# Patient Record
Sex: Male | Born: 1990 | Race: Black or African American | Hispanic: No | Marital: Single | Smoking: Current some day smoker
Health system: Southern US, Community
[De-identification: ages and names within clinical notes are randomized; demographics above are authoritative.]

## PROBLEM LIST (undated history)

## (undated) DIAGNOSIS — F329 Major depressive disorder, single episode, unspecified: Secondary | ICD-10-CM

## (undated) DIAGNOSIS — F319 Bipolar disorder, unspecified: Secondary | ICD-10-CM

## (undated) DIAGNOSIS — F32A Depression, unspecified: Secondary | ICD-10-CM

## (undated) HISTORY — PX: NO PAST SURGERIES: SHX2092

---
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
## 2012-08-04 ENCOUNTER — Emergency Department (INDEPENDENT_AMBULATORY_CARE_PROVIDER_SITE_OTHER)
Admission: EM | Admit: 2012-08-04 | Discharge: 2012-08-04 | Disposition: A | Discharge status: Home / Self Care | Payer: BC Managed Care – PPO | Source: Home / Self Care | Attending: Emergency Medicine | Admitting: Emergency Medicine

## 2012-08-04 ENCOUNTER — Encounter (HOSPITAL_COMMUNITY): Payer: Self-pay | Admitting: *Deleted

## 2012-08-04 DIAGNOSIS — B079 Viral wart, unspecified: Secondary | ICD-10-CM

## 2012-08-04 NOTE — ED Notes (Signed)
Pt reports foreign body in top of head that has been there for over a year

## 2012-08-04 NOTE — ED Provider Notes (Signed)
Chief Complaint  Patient presents with  . Foreign Body in Skin    History of Present Illness:   The patient is an 21 year old male who has a lesion on his right parietal area which has been present for about the past year. He denies any injury, but the area. It's not painful or itchy, just a little bit irritating. He denies any other skin lesions.  Review of Systems:  Other than noted above, the patient denies any of the following symptoms: Systemic:  No fever, chills, sweats, weight loss, or fatigue. ENT:  No nasal congestion, rhinorrhea, sore throat, swelling of lips, tongue or throat. Resp:  No cough, wheezing, or shortness of breath. Skin:  No rash, itching, nodules, or suspicious lesions.  PMFSH:  Past medical history, family history, social history, meds, and allergies were reviewed.  Physical Exam:   Vital signs:  BP 113/68  Pulse 75  Temp 98.4 F (36.9 C) (Oral)  Resp 16  SpO2 99% Gen:  Alert, oriented, in no distress. ENT:  Pharynx clear, no intraoral lesions, moist mucous membranes. Lungs:  Clear to auscultation. Skin:  There is a 5 mm wartlike lesion on the right parietal area.  Procedure Note:  Verbal informed consent was obtained from the patient.  Risks and benefits were outlined with the patient.  Patient understands and accepts these risks.  Identity of the patient was confirmed verbally and by armband.    Procedure was performed as followed:  The lesion was prepped with alcohol, anesthetized with 2 mL of 2% Xylocaine with epinephrine, then cauterized with electrocautery. After vigorous cauterization, the lesion just fell off on its own and the base was then cauterized further. There was no bleeding. The patient was instructed in wound care.  Patient tolerated the procedure well without any immediate complications.  Assessment:  The encounter diagnosis was Wart.  Plan:   1.  The following meds were prescribed:   New Prescriptions   No medications on file   2.   The patient was instructed in symptomatic care and handouts were given. 3.  The patient was told to return if becoming worse in any way, if no better in 3 or 4 days, and given some red flag symptoms that would indicate earlier return.     Reuben Likes, MD 08/04/12 [phone]

## 2013-12-21 ENCOUNTER — Encounter (HOSPITAL_COMMUNITY): Payer: Self-pay | Admitting: Emergency Medicine

## 2013-12-21 ENCOUNTER — Emergency Department (HOSPITAL_COMMUNITY)
Admission: EM | Admit: 2013-12-21 | Discharge: 2013-12-21 | Disposition: A | Discharge status: Home / Self Care | Payer: BC Managed Care – PPO | Attending: Emergency Medicine | Admitting: Emergency Medicine

## 2013-12-21 DIAGNOSIS — F32A Depression, unspecified: Secondary | ICD-10-CM

## 2013-12-21 DIAGNOSIS — F3289 Other specified depressive episodes: Secondary | ICD-10-CM | POA: Insufficient documentation

## 2013-12-21 DIAGNOSIS — F329 Major depressive disorder, single episode, unspecified: Secondary | ICD-10-CM | POA: Insufficient documentation

## 2013-12-21 DIAGNOSIS — F17200 Nicotine dependence, unspecified, uncomplicated: Secondary | ICD-10-CM | POA: Insufficient documentation

## 2013-12-21 DIAGNOSIS — R45851 Suicidal ideations: Secondary | ICD-10-CM | POA: Insufficient documentation

## 2013-12-21 LAB — COMPREHENSIVE METABOLIC PANEL
ALT: 8 U/L (ref 0–53)
AST: 17 U/L (ref 0–37)
Albumin: 4.6 g/dL (ref 3.5–5.2)
Alkaline Phosphatase: 75 U/L (ref 39–117)
BUN: 17 mg/dL (ref 6–23)
CALCIUM: 9.8 mg/dL (ref 8.4–10.5)
CO2: 24 mEq/L (ref 19–32)
Chloride: 103 mEq/L (ref 96–112)
Creatinine, Ser: 0.96 mg/dL (ref 0.50–1.35)
GFR calc non Af Amer: >90 mL/min (ref >90)
GLUCOSE: 96 mg/dL (ref 70–99)
Potassium: 4.3 mEq/L (ref 3.7–5.3)
SODIUM: 141 meq/L (ref 137–147)
TOTAL PROTEIN: 8.2 g/dL (ref 6.0–8.3)
Total Bilirubin: 0.6 mg/dL (ref 0.3–1.2)

## 2013-12-21 LAB — ACETAMINOPHEN LEVEL: Acetaminophen (Tylenol), Serum: <15 ug/mL (ref 10–30)

## 2013-12-21 LAB — CBC WITH DIFFERENTIAL/PLATELET
Basophils Absolute: 0 10*3/uL (ref 0.0–0.1)
Basophils Relative: 0 % (ref 0–1)
EOS ABS: 0.2 10*3/uL (ref 0.0–0.7)
EOS PCT: 2 % (ref 0–5)
HCT: 50.8 % (ref 39.0–52.0)
Hemoglobin: 16.8 g/dL (ref 13.0–17.0)
LYMPHS ABS: 3.4 10*3/uL (ref 0.7–4.0)
Lymphocytes Relative: 49 % — ABNORMAL HIGH (ref 12–46)
MCH: 30.2 pg (ref 26.0–34.0)
MCHC: 33.1 g/dL (ref 30.0–36.0)
MCV: 91.2 fL (ref 78.0–100.0)
MONOS PCT: 6 % (ref 3–12)
Monocytes Absolute: 0.4 10*3/uL (ref 0.1–1.0)
Neutro Abs: 2.9 10*3/uL (ref 1.7–7.7)
Neutrophils Relative %: 43 % (ref 43–77)
PLATELETS: 218 10*3/uL (ref 150–400)
RBC: 5.57 MIL/uL (ref 4.22–5.81)
RDW: 13.5 % (ref 11.5–15.5)
WBC: 6.8 10*3/uL (ref 4.0–10.5)

## 2013-12-21 LAB — RAPID URINE DRUG SCREEN, HOSP PERFORMED
AMPHETAMINES: NOT DETECTED
BARBITURATES: NOT DETECTED
BENZODIAZEPINES: NOT DETECTED
COCAINE: NOT DETECTED
Opiates: NOT DETECTED
TETRAHYDROCANNABINOL: POSITIVE — AB

## 2013-12-21 LAB — ETHANOL

## 2013-12-21 LAB — SALICYLATE LEVEL: Salicylate Lvl: <2 mg/dL — ABNORMAL LOW (ref 2.8–20.0)

## 2013-12-21 NOTE — BH Assessment (Signed)
[facility] Assessment Progress Note  At 20:36 I spoke to EDP Dr Fayrene FearingJames in anticipation of TTS assessment.  Doylene Canninghomas Caedyn Raygoza, MA Triage Specialist 12/21/2013 @ 20:40

## 2013-12-21 NOTE — ED Notes (Signed)
Bed: WBH39 Expected date:  Expected time:  Means of arrival:  Comments: Hold for triage 1 

## 2013-12-21 NOTE — ED Notes (Signed)
After triaging patient.  Pt stated that he wanted to leave.  Attempted to let patient know that MD needed to evaluate patient dt suicidal ideations.  Pt again stated he was leaving and started walking out of building.  D/T extreme risk for patient safety, I notified security to assist in bringing patient back  Into building.  Notified MD of occurrence.  Dr. Fayrene FearingJames went immediately to see patient.

## 2013-12-21 NOTE — ED Notes (Signed)
Pt states that he took 2 xanax last night and feel that because of this his emotions are heightened. When he saw his mother today after she was released from the hospital, he felt like he was still high and said something he wasn't suppose to say. Pt encouraged to change into scrubs, provide urine and allow blood work to be drawn. Then to discuss this with the counselor

## 2013-12-21 NOTE — BH Assessment (Signed)
Assessment Note  Mitchell Fuller is a 23 y.o. single black male.  [facility] initially accompanied by his girlfriend, his pastor, and [employer] officers.  No IVC paperwork accompanied the pt.  At the time of this assessment, pt is unaccompanied.  He is here today after sending a suicidal statement by text message to the pastor.  Stressors: Pt reports that within the past year his grandmother and his uncle, both of whom were involved in raising him, perished.  Today he discovered that he mother had recently been hospitalized unbeknownst to the pt.  These events were very upsetting to the pt and caused him to seek the support of the pastor.  Pt also reports that the accompanying girlfriend, with whom pt lives, is a relationship of convenience because she lives nearby.  However, his true love is the mother of his 186 month old child who lives some distance away.  He is hoping to be able to pursue that relationship more fully in the future.  Juggling these relationships has also been stressful for the pt.  Lethality: Suicidality: Pt reports that today, after hearing about his mother's recent hospitalization, he sent a text message to his pastor saying, "I feel like killing myself."  He reports that he has a close relationship with the pastor, often joking with him.  As a consequence, he believed that the pastor might not respond immediately to a message of lesser urgency.  Pt denies that there was any actual suicidal thought that accompanied the message, and in fact, denies ever having suicidal thought.  However, he reports that he has made similar statements under similar circumstances in the past, and in the fall of 2014 they resulted in pt being admitted to a psychiatric hospital.  Pt denies any current or past suicide plans or intent, any history of suicide attempts, or any history of self mutilation.  His mood is bright and pleasant during assessment, but he acknowledges some  recent problems with anxiety including agoraphobia, as well as depression with symptoms noted in the "risk to self" assessment below. Homicidality: Pt denies homicidal thoughts or physical aggression.  Pt denies having access to firearms.  Pt reports having a court date on 01/02/2014 for driving a car with expired registration.  Pt was very agitated after being brought to the psychiatric holding area of the ED, wanting to leave immediately, but became calm, cooperative and pleasant during assessment. Psychosis: Pt denies any recent hallucinations, but did experience them while on Zoloft several months ago.  Pt does not appear to be responding to internal stimuli and exhibits no delusional thought.  Pt's reality testing appears to be intact. Substance Abuse: Pt denies any current or past substance abuse problems.  Pt does not appear to be intoxicated or in withdrawal at this time.  Social History: Pt identifies his mother as his main social support.  He also has a close relationship with his pastor.  As noted he was raised in part by his now deceased grandmother and uncle.  He lives with the aforesaid girlfriend, along with the girlfriend's cousin, the cousin's boyfriend, and other roommates.  Pt has a 266 month old son, with whom he has an ongoing relationship, along with the son's mother, as noted above.  It would appear that he conceals from the girlfriend his intentions for the future with the son's mother, while concealing from the mother his relationship with the girlfriend.  Pt recently found employment with [employer].  Treatment History: As noted above, pt made a suicidal statement in the fall of 2014 that resulted in him being held at the [facility] for three days, evidently while they sought placement for him.  He was apparently transferred to [facility] under IVC for several days, where he was started on Zoloft.  He followed up with [facility] upon discharge, but did not like  the service that they provided, and after experiencing averse reactions to the Zoloft, he stopped taking it and stopped keeping appointments.  He also reports that at 23 y/o he again made attention seeking suicidal statements that resulted in him going to a [facility] ED, receiving a tele-psychiatry consult, and being discharged home.  Today pt does not feel that he presents a life threatening danger to himself.  He does not want to be admitted to a psychiatric facility, fearing that it would jeopardize his recently gained employment.  He is willing to contract for safety.  He is very much interested in receiving referrals to outpatient behavioral health providers.  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 Medical history non-contributory    Axis IV: problems related to legal system/crime, problems with primary support group and parent-child relational problems and problems related to grieving Axis V: GAF = 45  Past Medical History:  Past Medical History  Diagnosis Date  . Medical history non-contributory     Past Surgical History  Procedure Laterality Date  . No past surgeries      Family History: History reviewed. No pertinent family history.  Social History:  reports that he has been smoking.  He does not have any smokeless tobacco history on file. He reports that he drinks alcohol. He reports that he uses illicit drugs (Marijuana).  Additional Social History:  Alcohol / Drug Use Pain Medications: Denies Prescriptions: Denies Over the Counter: Denies History of alcohol / drug use?: No history of alcohol / drug abuse  CIWA: CIWA-Ar BP: 131/87 mmHg Pulse Rate: 80 COWS:    Allergies: No Known Allergies  Home Medications:  (Not in a hospital admission)  OB/GYN Status:  No LMP for male patient.  General Assessment Data Location of Assessment: [facility] ED Is this a Tele or  Face-to-Face Assessment?: Face-to-Face Is this an Initial Assessment or a Re-assessment for this encounter?: Initial Assessment Living Arrangements: Other (Comment) (Girlfriend, her cousin, & several roommates) Can pt return to current living arrangement?: Yes Admission Status: Voluntary Is patient capable of signing voluntary admission?: Yes Transfer from: Acute Hospital Referral Source: Other Leisure centre manager)     [facility] Crisis Care Plan Living Arrangements: Other (Comment) (Girlfriend, her cousin, & several roommates) Name of Psychiatrist: None Name of Therapist: None  Education Status Is patient currently in school?: No Contact person: Leone Payor - mother - [phone]  Risk to self Suicidal Ideation: No Suicidal Intent: No Is patient at risk for suicide?: No Suicidal Plan?: No Access to Means: No What has been your use of drugs/alcohol within the last 12 months?: Denies Previous Attempts/Gestures: No How many times?: 0 Other Self Harm Risks: Sent TM, "I feel like killing myself," to pastor today as attention seeking gesture.  Denies ever having plan or intent. Triggers for Past Attempts: Other (Comment) (Not applicable) Intentional Self Injurious Behavior: None Family Suicide History: No Recent stressful life event(s): Loss (Comment);Other (Comment) (Death of 2 loved ones, mom's illness; Px w/ girlfriend &  son) Persecutory voices/beliefs?: No Depression: Yes Depression Symptoms: Tearfulness;Fatigue (Inverted sleep pattern) Substance abuse history and/or treatment for substance abuse?: No Suicide prevention information given to non-admitted patients: Yes  Risk to Others Homicidal Ideation: No Thoughts of Harm to Others: No Current Homicidal Intent: No Current Homicidal Plan: No Access to Homicidal Means: No Identified Victim: None History of harm to others?: No Assessment of Violence: None Noted Violent Behavior Description: Calm/cooperative/pleasant Does patient have  access to weapons?: No (Denies having firearms) Criminal Charges Pending?: Yes Describe Pending Criminal Charges: Expired vehicle registration Does patient have a court date: Yes Court Date: 01/02/14  Psychosis Hallucinations: None noted (Hx of hallucinations while on Zoloft; none since D/C'ed) Delusions: None noted  Mental Status Report Appear/Hygiene: Other (Comment) (Paper scrubs) Eye Contact: Good Motor Activity: Unremarkable Speech: Other (Comment) (Unremarkable) Level of Consciousness: Alert Mood: Other (Comment) (Pleasant, bright) Affect: Appropriate to circumstance Anxiety Level: None (Reports occasional panic attacks w/ agoraphobia) Thought Processes: Coherent;Relevant Judgement: Impaired (Partial) Orientation: Person;Place;Time;Situation Obsessive Compulsive Thoughts/Behaviors: Minimal  Cognitive Functioning Concentration: Decreased Memory: Recent Intact;Remote Intact IQ: Average Insight: Good Impulse Control: Fair Appetite: Fair (Variable) Weight Loss: 0 Weight Gain: 0 Sleep: No Change Total Hours of Sleep: 7 (6 - 7 w/ inverted pattern) Vegetative Symptoms: None  ADLScreening [facility] Assessment Services) Patient's cognitive ability adequate to safely complete daily activities?: Yes Patient able to express need for assistance with ADLs?: No Independently performs ADLs?: Yes (appropriate for developmental age)  Prior Inpatient Therapy Prior Inpatient Therapy: Yes Prior Therapy Dates: Fall of 2014: [facility] for suicidal statement similar to today.  Prior Outpatient Therapy Prior Outpatient Therapy: Yes Prior Therapy Facilty/Provider(s): Falll of 2014: [facility] for suicidal statement similar to today; also followed up with them after discharge from [facility]  ADL Screening (condition at time of admission) Patient's cognitive ability adequate to safely complete daily activities?: Yes Is the patient deaf or have difficulty hearing?: No Does the patient have  difficulty seeing, even when wearing glasses/contacts?: No Does the patient have difficulty concentrating, remembering, or making decisions?: Yes Patient able to express need for assistance with ADLs?: No Does the patient have difficulty dressing or bathing?: No Independently performs ADLs?: Yes (appropriate for developmental age) Does the patient have difficulty walking or climbing stairs?: No Weakness of Legs: None Weakness of Arms/Hands: None  Home Assistive Devices/Equipment Home Assistive Devices/Equipment: None    Abuse/Neglect Assessment (Assessment to be complete while patient is alone) Physical Abuse: Denies Verbal Abuse: Denies Sexual Abuse: Denies Exploitation of patient/patient's resources: Denies Self-Neglect: Denies Values / Beliefs Cultural Requests During Hospitalization: Other (comment) (Identifies his pastor, Rev. Edmund Hilda, as a social support.) Spiritual Requests During Hospitalization: None   Advance Directives (For Healthcare) Advance Directive: Patient does not have advance directive;Patient would not like information Pre-existing out of facility DNR order (yellow form or pink MOST form): No Nutrition Screen- [facility] Patient's home diet: Regular  Additional Information 1:1 In Past 12 Months?: No CIRT Risk: No Elopement Risk: No Does patient have medical clearance?: Yes     Disposition:  Disposition Initial Assessment Completed for this Encounter: Yes Disposition of Patient: Referred to Patient referred to: Other (Comment) ([facility]; [facility]) After consulting with Alberteen Sam, NP it has been determined that, provided pt is able to contract for safety, he does not present a life threatening danger to himself or others, and does not require psychiatric hospitalization.  Under these circumstances pt would benefit from outpatient referrals.  At  21:55 I spoke to EDP Dr Fayrene Fearing, who concurs with this opinion.  Pt  signed a Engineer, manufacturing systems and accepted referrals to the [facility] for grief counseling, as well as the [facility], and [facility] [place].  On Site Evaluation by:   Reviewed with Physician:  Alberteen Sam, NP @ 21:44  Doylene Canning, MA Triage Specialist Raphael Gibney 12/21/2013 10:43 PM

## 2013-12-21 NOTE — ED Notes (Signed)
Per Pt, was brought here by pastor b/c he expressed thoughts of hurting himself.  Pt states he has had a lot of stress and deaths in his family.  Pt was [facility] at [facility] in the summer last year and was placed on zoloft.  Only took 1 week.  Pt has male friend with him in lobby.  Pt has had previous thoughts of suicide.  No attempts.  No plan.

## 2013-12-21 NOTE — ED Notes (Signed)
Patient attempting to leave unit, demanding to go immediately. Pt states "I will not be here tonight you can best believe it!" Pt needing frequent redirection. Pt's girlfriend calling unit multiple times, which triggered pt into a rage. GF asked to leave waiting room, as pt not leaving tonight. Pt's GF called a 5th time to unit, staff again asked her not to call at this time, she began to yell at staff "How am I a trigger? How am I upsetting him?". GF not willing to listen to staff's request. Staff aware of situation.

## 2013-12-21 NOTE — ED Notes (Signed)
Pt arrived to unit; no s/s of distress noted. Pt pleasant with bright affect, cooperative with assessment. Pt denies SI/HI or A/V hallucinations. Pt states he needs to "stay away from drugs". Pt states he says things that he doesn't mean when he is high. Pt verbally contracts for safety and states he has no plans to harm himself.

## 2013-12-21 NOTE — ED Provider Notes (Signed)
CSN: [id number]     Arrival date & time 12/21/13  1740 History   First MD Initiated Contact with Patient 12/21/13 1827     Chief Complaint  Patient presents with  . Medical Clearance      HPI  Patient presents with a complaint of depression and suicidal text. Patient states that he has been sad and depressed because he lost his uncle and grandfather. And lives in a home where he grew up. He also states that his mother has been sick a lot in and out of the hospital recently. States "every time she comes on she comes on with more medicines , and I'm afraid she is going to die 2.". He states that he is afraid is going to be alone. He is close with his pastor. He sent his pastor a text today and said "I want to kill myself". He states that he was trying to get his Pastor'sattention because they often joke with one another.  Past Medical History  Diagnosis Date  . Medical history non-contributory    Past Surgical History  Procedure Laterality Date  . No past surgeries     History reviewed. No pertinent family history. History  Substance Use Topics  . Smoking status: Current Some Day Smoker -- 0.50 packs/day  . Smokeless tobacco: Not on file  . Alcohol Use: Yes     Comment: social    Review of Systems  Constitutional: Negative for fever, chills, diaphoresis, appetite change and fatigue.  HENT: Negative for mouth sores, sore throat and trouble swallowing.   Eyes: Negative for visual disturbance.  Respiratory: Negative for cough, chest tightness, shortness of breath and wheezing.   Cardiovascular: Negative for chest pain.  Gastrointestinal: Negative for nausea, vomiting, abdominal pain, diarrhea and abdominal distention.  Endocrine: Negative for polydipsia, polyphagia and polyuria.  Genitourinary: Negative for dysuria, frequency and hematuria.  Musculoskeletal: Negative for gait problem.  Skin: Negative for color change, pallor and rash.  Neurological: Negative for dizziness, syncope,  light-headedness and headaches.  Hematological: Does not bruise/bleed easily.  Psychiatric/Behavioral: Negative for suicidal ideas, behavioral problems, confusion and self-injury.       Depression      Allergies  Review of patient's allergies indicates no known allergies.  Home Medications  No current outpatient prescriptions on file. BP 119/82  Pulse 90  Temp(Src) 98.2 F (36.8 C) (Oral)  Resp 18  SpO2 98% Physical Exam  Constitutional: He is oriented to person, place, and time. He appears well-developed and well-nourished. No distress.  HENT:  Head: Normocephalic.  Eyes: Conjunctivae are normal. Pupils are equal, round, and reactive to light. No scleral icterus.  Neck: Normal range of motion. Neck supple. No thyromegaly present.  Cardiovascular: Normal rate and regular rhythm.  Exam reveals no gallop and no friction rub.   No murmur heard. Pulmonary/Chest: Effort normal and breath sounds normal. No respiratory distress. He has no wheezes. He has no rales.  Abdominal: Soft. Bowel sounds are normal. He exhibits no distension. There is no tenderness. There is no rebound.  Musculoskeletal: Normal range of motion.  Neurological: He is alert and oriented to person, place, and time.  Skin: Skin is warm and dry. No rash noted.  Psychiatric: He has a normal mood and affect. His behavior is normal.  He is pleasant and interactive.    ED Course  Procedures (including critical care time) Labs Review Labs Reviewed  URINE RAPID DRUG SCREEN (HOSP PERFORMED) - Abnormal; Notable for the following:  Tetrahydrocannabinol POSITIVE (*)    All other components within normal limits  SALICYLATE LEVEL - Abnormal; Notable for the following:    Salicylate Lvl <2.0 (*)    All other components within normal limits  CBC WITH DIFFERENTIAL - Abnormal; Notable for the following:    Lymphocytes Relative 49 (*)    All other components within normal limits  ACETAMINOPHEN LEVEL  COMPREHENSIVE  METABOLIC PANEL  ETHANOL   Imaging Review No results found.   EKG Interpretation None      MDM   Final diagnoses:  Depression    Ricklefs initially did not want to come into the emergency room because he was afraid he would be going to [facility]. He had a bad experience there was admitted for depression last year. He's never made suicide attempt. He does not feel so his arrest attempt now. He has no plan. He was given multiple outpatient resources regarding followup for counseling for his depression and for discrete. Comfortable and is not in any imminent risk to himself or others.    Rolland PorterMark Betania Dizon, MD 12/21/13 2228

## 2013-12-21 NOTE — Discharge Instructions (Signed)
Depression, Adult °Depression is feeling sad, low, down in the dumps, blue, gloomy, or empty. In general, there are two kinds of depression: °· Normal sadness or grief. This can happen after something upsetting. It often goes away on its own within 2 weeks. After losing a loved one (bereavement), normal sadness and grief may last longer than two weeks. It usually gets better with time. °· Clinical depression. This kind lasts longer than normal sadness or grief. It keeps you from doing the things you normally do in life. It is often hard to function at home, work, or at school. It may affect your relationships with others. Treatment is often needed. °GET HELP RIGHT AWAY IF: °· You have thoughts about hurting yourself or others. °· You lose touch with reality (psychotic symptoms). You may: °· See or hear things that are not real. °· Have untrue beliefs about your life or people around you. °· Your medicine is giving you problems. °MAKE SURE YOU: °· Understand these instructions. °· Will watch your condition. °· Will get help right away if you are not doing well or get worse. °Document Released: 11/07/2010 Document Revised: 06/29/2012 Document Reviewed: 02/04/2012 °[employer]® Patient Information ©2014 [employer]. ° °

## 2014-05-26 ENCOUNTER — Encounter (HOSPITAL_COMMUNITY): Payer: Self-pay | Admitting: Emergency Medicine

## 2014-05-26 ENCOUNTER — Emergency Department (HOSPITAL_COMMUNITY)
Admission: EM | Admit: 2014-05-26 | Discharge: 2014-05-26 | Disposition: A | Discharge status: Home / Self Care | Payer: BC Managed Care – PPO | Attending: Emergency Medicine | Admitting: Emergency Medicine

## 2014-05-26 DIAGNOSIS — S39012A Strain of muscle, fascia and tendon of lower back, initial encounter: Secondary | ICD-10-CM

## 2014-05-26 DIAGNOSIS — Y9389 Activity, other specified: Secondary | ICD-10-CM | POA: Insufficient documentation

## 2014-05-26 DIAGNOSIS — Z789 Other specified health status: Secondary | ICD-10-CM | POA: Insufficient documentation

## 2014-05-26 DIAGNOSIS — Y92410 Unspecified street and highway as the place of occurrence of the external cause: Secondary | ICD-10-CM | POA: Insufficient documentation

## 2014-05-26 DIAGNOSIS — S335XXA Sprain of ligaments of lumbar spine, initial encounter: Secondary | ICD-10-CM | POA: Insufficient documentation

## 2014-05-26 DIAGNOSIS — F17200 Nicotine dependence, unspecified, uncomplicated: Secondary | ICD-10-CM | POA: Insufficient documentation

## 2014-05-26 MED ORDER — IBUPROFEN 800 MG PO TABS: 800.0000 mg | ORAL_TABLET | Freq: Three times a day (TID) | ORAL | Status: DC

## 2014-05-26 MED ORDER — CYCLOBENZAPRINE HCL 10 MG PO TABS: 10.0000 mg | ORAL_TABLET | Freq: Two times a day (BID) | ORAL | Status: DC | PRN

## 2014-05-26 MED ORDER — IBUPROFEN 400 MG PO TABS
800.0000 mg | ORAL_TABLET | Freq: Once | ORAL | Status: AC
  Administered 2014-05-26: 800 mg via ORAL
  Filled 2014-05-26: qty 2

## 2014-05-26 NOTE — Discharge Instructions (Signed)
Back Pain, Adult Low back pain is very common. About 1 in 5 people have back pain.The cause of low back pain is rarely dangerous. The pain often gets better over time.About half of people with a sudden onset of back pain feel better in just 2 weeks. About 8 in 10 people feel better by 6 weeks.  CAUSES Some common causes of back pain include:  Strain of the muscles or ligaments supporting the spine.  Wear and tear (degeneration) of the spinal discs.  Arthritis.  Direct injury to the back. DIAGNOSIS Most of the time, the direct cause of low back pain is not known.However, back pain can be treated effectively even when the exact cause of the pain is unknown.Answering your caregiver's questions about your overall health and symptoms is one of the most accurate ways to make sure the cause of your pain is not dangerous. If your caregiver needs more information, he or she may order lab work or imaging tests (X-rays or MRIs).However, even if imaging tests show changes in your back, this usually does not require surgery. HOME CARE INSTRUCTIONS For many people, back pain returns.Since low back pain is rarely dangerous, it is often a condition that people can learn to manageon their own.   Remain active. It is stressful on the back to sit or stand in one place. Do not sit, drive, or stand in one place for more than 30 minutes at a time. Take short walks on level surfaces as soon as pain allows.Try to increase the length of time you walk each day.  Do not stay in bed.Resting more than 1 or 2 days can delay your recovery.  Do not avoid exercise or work.Your body is made to move.It is not dangerous to be active, even though your back may hurt.Your back will likely heal faster if you return to being active before your pain is gone.  Pay attention to your body when you bend and lift. Many people have less discomfortwhen lifting if they bend their knees, keep the load close to their bodies,and  avoid twisting. Often, the most comfortable positions are those that put less stress on your recovering back.  Find a comfortable position to sleep. Use a firm mattress and lie on your side with your knees slightly bent. If you lie on your back, put a pillow under your knees.  Only take over-the-counter or prescription medicines as directed by your caregiver. Over-the-counter medicines to reduce pain and inflammation are often the most helpful.Your caregiver may prescribe muscle relaxant drugs.These medicines help dull your pain so you can more quickly return to your normal activities and healthy exercise.  Put ice on the injured area.  Put ice in a plastic bag.  Place a towel between your skin and the bag.  Leave the ice on for 15-20 minutes, 03-04 times a day for the first 2 to 3 days. After that, ice and heat may be alternated to reduce pain and spasms.  Ask your caregiver about trying back exercises and gentle massage. This may be of some benefit.  Avoid feeling anxious or stressed.Stress increases muscle tension and can worsen back pain.It is important to recognize when you are anxious or stressed and learn ways to manage it.Exercise is a great option. SEEK MEDICAL CARE IF:  You have pain that is not relieved with rest or medicine.  You have pain that does not improve in 1 week.  You have new symptoms.  You are generally not feeling well. SEEK   IMMEDIATE MEDICAL CARE IF:   You have pain that radiates from your back into your legs.  You develop new bowel or bladder control problems.  You have unusual weakness or numbness in your arms or legs.  You develop nausea or vomiting.  You develop abdominal pain.  You feel faint. Document Released: 10/05/2005 Document Revised: 04/05/2012 Document Reviewed: 02/06/2014 Exit[employer] Patient Information 2015 [employer]. This information is not intended to replace advice given to you by your health care provider. Make sure you  discuss any questions you have with your health care provider.  
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
## 2014-05-26 NOTE — ED Notes (Signed)
Declined W/C at D/C and was escorted to lobby by RN. 

## 2014-05-27 NOTE — ED Provider Notes (Signed)
Medical screening examination/treatment/procedure(s) were performed by non-physician practitioner and as supervising physician I was immediately available for consultation/collaboration.   EKG Interpretation None        Gauri Galvao, MD 05/27/14 1221 

## 2014-06-01 ENCOUNTER — Emergency Department (HOSPITAL_COMMUNITY): Payer: BC Managed Care – PPO

## 2014-06-01 ENCOUNTER — Encounter (HOSPITAL_COMMUNITY): Payer: Self-pay | Admitting: Emergency Medicine

## 2014-06-01 ENCOUNTER — Observation Stay (HOSPITAL_COMMUNITY)
Admission: EM | Admit: 2014-06-01 | Discharge: 2014-06-01 | Disposition: A | Discharge status: Home / Self Care | Payer: BC Managed Care – PPO | Attending: Internal Medicine | Admitting: Internal Medicine

## 2014-06-01 DIAGNOSIS — G929 Unspecified toxic encephalopathy: Secondary | ICD-10-CM | POA: Insufficient documentation

## 2014-06-01 DIAGNOSIS — T50901A Poisoning by unspecified drugs, medicaments and biological substances, accidental (unintentional), initial encounter: Secondary | ICD-10-CM

## 2014-06-01 DIAGNOSIS — T424X4A Poisoning by benzodiazepines, undetermined, initial encounter: Secondary | ICD-10-CM | POA: Insufficient documentation

## 2014-06-01 DIAGNOSIS — R404 Transient alteration of awareness: Secondary | ICD-10-CM

## 2014-06-01 DIAGNOSIS — R400 Somnolence: Secondary | ICD-10-CM

## 2014-06-01 DIAGNOSIS — Z720 Tobacco use: Secondary | ICD-10-CM

## 2014-06-01 DIAGNOSIS — G92 Toxic encephalopathy: Secondary | ICD-10-CM | POA: Insufficient documentation

## 2014-06-01 DIAGNOSIS — R4182 Altered mental status, unspecified: Secondary | ICD-10-CM

## 2014-06-01 DIAGNOSIS — F3289 Other specified depressive episodes: Secondary | ICD-10-CM | POA: Insufficient documentation

## 2014-06-01 DIAGNOSIS — F17200 Nicotine dependence, unspecified, uncomplicated: Secondary | ICD-10-CM | POA: Insufficient documentation

## 2014-06-01 DIAGNOSIS — T40904A Poisoning by unspecified psychodysleptics [hallucinogens], undetermined, initial encounter: Principal | ICD-10-CM | POA: Insufficient documentation

## 2014-06-01 DIAGNOSIS — I959 Hypotension, unspecified: Secondary | ICD-10-CM | POA: Insufficient documentation

## 2014-06-01 DIAGNOSIS — F329 Major depressive disorder, single episode, unspecified: Secondary | ICD-10-CM | POA: Insufficient documentation

## 2014-06-01 DIAGNOSIS — T43591A Poisoning by other antipsychotics and neuroleptics, accidental (unintentional), initial encounter: Secondary | ICD-10-CM | POA: Insufficient documentation

## 2014-06-01 DIAGNOSIS — Y9289 Other specified places as the place of occurrence of the external cause: Secondary | ICD-10-CM | POA: Insufficient documentation

## 2014-06-01 DIAGNOSIS — E860 Dehydration: Secondary | ICD-10-CM | POA: Insufficient documentation

## 2014-06-01 HISTORY — DX: Major depressive disorder, single episode, unspecified: F32.9

## 2014-06-01 HISTORY — DX: Depression, unspecified: F32.A

## 2014-06-01 LAB — CBC WITH DIFFERENTIAL/PLATELET
Basophils Absolute: 0 10*3/uL (ref 0.0–0.1)
Basophils Relative: 0 % (ref 0–1)
Eosinophils Absolute: 0.6 10*3/uL (ref 0.0–0.7)
Eosinophils Relative: 9 % — ABNORMAL HIGH (ref 0–5)
HCT: 44.3 % (ref 39.0–52.0)
HEMOGLOBIN: 15.1 g/dL (ref 13.0–17.0)
LYMPHS ABS: 3.6 10*3/uL (ref 0.7–4.0)
Lymphocytes Relative: 53 % — ABNORMAL HIGH (ref 12–46)
MCH: 30.7 pg (ref 26.0–34.0)
MCHC: 34.1 g/dL (ref 30.0–36.0)
MCV: 90 fL (ref 78.0–100.0)
MONOS PCT: 4 % (ref 3–12)
Monocytes Absolute: 0.3 10*3/uL (ref 0.1–1.0)
NEUTROS ABS: 2.4 10*3/uL (ref 1.7–7.7)
NEUTROS PCT: 34 % — AB (ref 43–77)
Platelets: 236 10*3/uL (ref 150–400)
RBC: 4.92 MIL/uL (ref 4.22–5.81)
RDW: 12.8 % (ref 11.5–15.5)
WBC: 6.9 10*3/uL (ref 4.0–10.5)

## 2014-06-01 LAB — ACETAMINOPHEN LEVEL

## 2014-06-01 LAB — SALICYLATE LEVEL

## 2014-06-01 LAB — COMPREHENSIVE METABOLIC PANEL
ALBUMIN: 4 g/dL (ref 3.5–5.2)
ALK PHOS: 65 U/L (ref 39–117)
ALT: 12 U/L (ref 0–53)
ANION GAP: 12 (ref 5–15)
AST: 17 U/L (ref 0–37)
BILIRUBIN TOTAL: 0.4 mg/dL (ref 0.3–1.2)
BUN: 13 mg/dL (ref 6–23)
CHLORIDE: 103 meq/L (ref 96–112)
CO2: 25 mEq/L (ref 19–32)
Calcium: 9 mg/dL (ref 8.4–10.5)
Creatinine, Ser: 0.95 mg/dL (ref 0.50–1.35)
GFR calc non Af Amer: >90 mL/min (ref >90)
GLUCOSE: 89 mg/dL (ref 70–99)
POTASSIUM: 3.9 meq/L (ref 3.7–5.3)
Sodium: 140 mEq/L (ref 137–147)
Total Protein: 7 g/dL (ref 6.0–8.3)

## 2014-06-01 LAB — RAPID URINE DRUG SCREEN, HOSP PERFORMED
Amphetamines: NOT DETECTED
Barbiturates: NOT DETECTED
Benzodiazepines: POSITIVE — AB
Cocaine: NOT DETECTED
Opiates: NOT DETECTED
Tetrahydrocannabinol: POSITIVE — AB

## 2014-06-01 LAB — URINE MICROSCOPIC-ADD ON

## 2014-06-01 LAB — URINALYSIS, ROUTINE W REFLEX MICROSCOPIC
Bilirubin Urine: NEGATIVE
Glucose, UA: NEGATIVE mg/dL
KETONES UR: NEGATIVE mg/dL
LEUKOCYTES UA: NEGATIVE
NITRITE: NEGATIVE
PH: 6 (ref 5.0–8.0)
Protein, ur: NEGATIVE mg/dL
Specific Gravity, Urine: 1.01 (ref 1.005–1.030)
Urobilinogen, UA: 0.2 mg/dL (ref 0.0–1.0)

## 2014-06-01 LAB — ETHANOL

## 2014-06-01 LAB — I-STAT CG4 LACTIC ACID, ED: LACTIC ACID, VENOUS: 0.58 mmol/L (ref 0.5–2.2)

## 2014-06-01 MED ORDER — SODIUM CHLORIDE 0.9 % IV SOLN
INTRAVENOUS | Status: DC
  Administered 2014-06-01: 14:00:00 via INTRAVENOUS

## 2014-06-01 MED ORDER — TETANUS-DIPHTH-ACELL PERTUSSIS 5-2.5-18.5 LF-MCG/0.5 IM SUSP
0.5000 mL | Freq: Once | INTRAMUSCULAR | Status: AC
  Administered 2014-06-01: 0.5 mL via INTRAMUSCULAR
  Filled 2014-06-01: qty 0.5

## 2014-06-01 MED ORDER — SODIUM CHLORIDE 0.9 % IV BOLUS (SEPSIS)
1000.0000 mL | Freq: Once | INTRAVENOUS | Status: AC
  Administered 2014-06-01: 1000 mL via INTRAVENOUS

## 2014-06-01 MED ORDER — NALOXONE HCL 0.4 MG/ML IJ SOLN
0.4000 mg | Freq: Once | INTRAMUSCULAR | Status: AC
  Administered 2014-06-01: 0.4 mg via INTRAVENOUS
  Filled 2014-06-01: qty 1

## 2014-06-01 NOTE — ED Notes (Signed)
Patient reports he doesn't recall how he ended up at [facility], reports he left work at 2:30am, reports he's had limited sleep due to working several jobs. Dr. Silverio LayYao at bedside, vitals stable.

## 2014-06-01 NOTE — ED Notes (Signed)
Dr. Silverio LayYao aware of low blood pressure
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
## 2014-06-01 NOTE — ED Notes (Signed)
Pt is slowly waking and confused. Pt is trying to unhook himself from monitor. Explained to pt we needed to keep him hooked up to be monitored. Asked pt if he knew where he was and he stated "No". Pt is also pulling covers over head and not wanting to be responsive. Family is going to notify if pt becomes agitated again. RN [employer] Aware

## 2014-06-01 NOTE — Discharge Summary (Signed)
Patient left AMA 1 hour after admission was completed. Please referred to H&P for further info/details of his hospital course, treatment and rec's.Mitchell Fuller.  Eman Rynders, Mikle Boswortharlos [phone]

## 2014-06-01 NOTE — ED Notes (Signed)
Patient unable to provide urine specimen at this time, patient currently drowsy and non verbal at this time. Patient to CT, mother at bedside.

## 2014-06-01 NOTE — ED Notes (Signed)
Attempted to call report to Onalee Huaavid, [place]. Will call back promptly.
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
## 2014-06-01 NOTE — ED Notes (Signed)
Bed: WA03 Expected date:  Expected time:  Means of arrival:  Comments: Bed 3, EMS, Unresponsive in field, now alert

## 2015-12-29 ENCOUNTER — Emergency Department (HOSPITAL_COMMUNITY)
Admission: EM | Admit: 2015-12-29 | Discharge: 2015-12-29 | Disposition: A | Discharge status: Home / Self Care | Payer: No Typology Code available for payment source | Attending: Physician Assistant | Admitting: Physician Assistant

## 2015-12-29 ENCOUNTER — Emergency Department (HOSPITAL_COMMUNITY): Payer: No Typology Code available for payment source

## 2015-12-29 ENCOUNTER — Encounter (HOSPITAL_COMMUNITY): Payer: Self-pay | Admitting: Emergency Medicine

## 2015-12-29 DIAGNOSIS — J069 Acute upper respiratory infection, unspecified: Secondary | ICD-10-CM

## 2015-12-29 DIAGNOSIS — F17210 Nicotine dependence, cigarettes, uncomplicated: Secondary | ICD-10-CM | POA: Insufficient documentation

## 2015-12-29 DIAGNOSIS — Z8659 Personal history of other mental and behavioral disorders: Secondary | ICD-10-CM | POA: Insufficient documentation

## 2015-12-29 LAB — RAPID STREP SCREEN (MED CTR MEBANE ONLY): Streptococcus, Group A Screen (Direct): NEGATIVE

## 2015-12-29 MED ORDER — IBUPROFEN 600 MG PO TABS: 600.0000 mg | ORAL_TABLET | Freq: Four times a day (QID) | ORAL | Status: DC | PRN

## 2015-12-29 MED ORDER — BENZONATATE 100 MG PO CAPS: 100.0000 mg | ORAL_CAPSULE | Freq: Three times a day (TID) | ORAL | Status: AC

## 2015-12-29 MED ORDER — BENZONATATE 100 MG PO CAPS
100.0000 mg | ORAL_CAPSULE | Freq: Once | ORAL | Status: AC
  Administered 2015-12-29: 100 mg via ORAL
  Filled 2015-12-29: qty 1

## 2015-12-29 MED ORDER — IBUPROFEN 800 MG PO TABS
800.0000 mg | ORAL_TABLET | Freq: Once | ORAL | Status: AC
  Administered 2015-12-29: 800 mg via ORAL
  Filled 2015-12-29: qty 1

## 2015-12-29 NOTE — ED Notes (Signed)
Per pt, states cough for over a week-sore throat that started 2 days ago-no relief with OTC meds

## 2015-12-29 NOTE — Discharge Instructions (Signed)
1. Medications: tessalon for cough, ibuprofen for sore throat, usual home medications 2. Treatment: rest, drink plenty of fluids; try warm honey, tea, throat lozenges for additional symptom relief 3. Follow Up: please followup with your primary doctor for discussion of your diagnoses and further evaluation after today's visit; if you do not have a primary care doctor use the phone number listed in your discharge paperwork to find one; please return to the ER for new or worsening symptoms   Upper Respiratory Infection, Adult Most upper respiratory infections (URIs) are caused by a virus. A URI affects the nose, throat, and upper air passages. The most common type of URI is often called "the common cold." HOME CARE   Take medicines only as told by your doctor.  Gargle warm saltwater or take cough drops to comfort your throat as told by your doctor.  Use a warm mist humidifier or inhale steam from a shower to increase air moisture. This may make it easier to breathe.  Drink enough fluid to keep your pee (urine) clear or pale yellow.  Eat soups and other clear broths.  Have a healthy diet.  Rest as needed.  Go back to work when your fever is gone or your doctor says it is okay.  You may need to stay home longer to avoid giving your URI to others.  You can also wear a face mask and wash your hands often to prevent spread of the virus.  Use your inhaler more if you have asthma.  Do not use any tobacco products, including cigarettes, chewing tobacco, or electronic cigarettes. If you need help quitting, ask your doctor. GET HELP IF:  You are getting worse, not better.  Your symptoms are not helped by medicine.  You have chills.  You are getting more short of breath.  You have brown or red mucus.  You have yellow or brown discharge from your nose.  You have pain in your face, especially when you bend forward.  You have a fever.  You have puffy (swollen) neck glands.  You  have pain while swallowing.  You have white areas in the back of your throat. GET HELP RIGHT AWAY IF:   You have very bad or constant:  Headache.  Ear pain.  Pain in your forehead, behind your eyes, and over your cheekbones (sinus pain).  Chest pain.  You have long-lasting (chronic) lung disease and any of the following:  Wheezing.  Long-lasting cough.  Coughing up blood.  A change in your usual mucus.  You have a stiff neck.  You have changes in your:  Vision.  Hearing.  Thinking.  Mood. MAKE SURE YOU:   Understand these instructions.  Will watch your condition.  Will get help right away if you are not doing well or get worse.   This information is not intended to replace advice given to you by your health care provider. Make sure you discuss any questions you have with your health care provider.   Document Released: 03/23/2008 Document Revised: 02/19/2015 Document Reviewed: 01/10/2014 Elsevier Interactive Patient Education [employer]2016 Elsevier Inc.

## 2015-12-29 NOTE — ED Provider Notes (Signed)
CSN: [id number]     Arrival date & time 12/29/15  1315 History  By signing my name below, I, Doreatha Martin, attest that this documentation has been prepared under the direction and in the presence of Mady Gemma, PA-C. Electronically Signed: Doreatha Martin, ED Scribe. 12/29/2015. 2:34 PM.    Chief Complaint  Patient presents with  . cough/sore throat     The history is provided by the patient. No language interpreter was used.    HPI Comments: Mitchell Fuller is a 25 y.o. male with no pertinent PMH who presents to the Emergency Department complaining of moderate sore throat onset 1.5 weeks ago with associated productive cough with green/brown sputum. Per pt, he also had fever, chills and congestion earlier in the week, but these symptoms have now resolved. Pt states his cough is worsened at night. He notes that is sore throat is worsened with eating, drinking, and swallowing. He states he has taken delsym and hydrocodone with little to no relief of symptoms. Pt notes recent sick contact with his girlfriend, who has similar symptoms, but is unsure of her specific dx. He denies SOB, CP, abdominal pain, nausea, emesis, otalgia, current fever, chills, or congestion.  Past Medical History  Diagnosis Date  . Depression    Past Surgical History  Procedure Laterality Date  . No past surgeries     No family history on file. Social History  Substance Use Topics  . Smoking status: Current Some Day Smoker -- 0.25 packs/day    Types: Cigarettes  . Smokeless tobacco: Never Used  . Alcohol Use: Yes     Comment: social     Review of Systems  Constitutional: Negative for fever and chills.  HENT: Positive for sore throat. Negative for ear pain.   Respiratory: Positive for cough. Negative for shortness of breath.   Cardiovascular: Negative for chest pain.  Gastrointestinal: Negative for nausea, vomiting and abdominal pain.    Allergies  Review of patient's allergies indicates no known  allergies.  Home Medications   Prior to Admission medications   Medication Sig Start Date End Date Taking? Authorizing Provider  benzonatate (TESSALON) 100 MG capsule Take 1 capsule (100 mg total) by mouth every 8 (eight) hours. 12/29/15   Mady Gemma, PA-C  ibuprofen (ADVIL,MOTRIN) 600 MG tablet Take 1 tablet (600 mg total) by mouth every 6 (six) hours as needed. 12/29/15   Mady Gemma, PA-C    BP 124/84 mmHg  Pulse 75  Temp(Src) 98.1 F (36.7 C) (Oral)  Resp 18  SpO2 100% Physical Exam  Constitutional: He is oriented to person, place, and time. He appears well-developed and well-nourished. No distress.  HENT:  Head: Normocephalic and atraumatic.  Right Ear: External ear normal.  Left Ear: External ear normal.  Nose: Nose normal.  Mouth/Throat: Uvula is midline, oropharynx is clear and moist and mucous membranes are normal. No oropharyngeal exudate, posterior oropharyngeal edema, posterior oropharyngeal erythema or tonsillar abscesses.  Eyes: Conjunctivae and EOM are normal. Pupils are equal, round, and reactive to light. Right eye exhibits no discharge. Left eye exhibits no discharge. No scleral icterus.  Neck: Normal range of motion. Neck supple.  Cardiovascular: Normal rate, regular rhythm, normal heart sounds and intact distal pulses.   Pulmonary/Chest: Effort normal and breath sounds normal. No respiratory distress. He has no wheezes. He has no rales. He exhibits no tenderness.  Abdominal: Soft. He exhibits no distension and no mass. There is no tenderness. There is no rebound and  no guarding.  Musculoskeletal: Normal range of motion. He exhibits no edema or tenderness.  Neurological: He is alert and oriented to person, place, and time.  Skin: Skin is warm and dry. He is not diaphoretic.  Psychiatric: He has a normal mood and affect. His behavior is normal.  Nursing note and vitals reviewed.   ED Course  Procedures (including critical care  time)  DIAGNOSTIC STUDIES: Oxygen Saturation is 100% on RA, normal by my interpretation.    COORDINATION OF CARE: 2:29 PM Discussed treatment plan with pt at bedside which includes rapid strep, CXR and pt agreed to plan.   Labs Review Labs Reviewed  RAPID STREP SCREEN (NOT AT [facility])  CULTURE, GROUP A STREP [facility]    Imaging Review Dg Chest 2 View  12/29/2015  CLINICAL DATA:  Fever, cough for 1-2 weeks. EXAM: CHEST  2 VIEW COMPARISON:  None. FINDINGS: The heart size and mediastinal contours are within normal limits. Both lungs are clear. The visualized skeletal structures are unremarkable. IMPRESSION: No active cardiopulmonary disease. Electronically Signed   By: Charlett NoseKevin  Dover M.D.   On: 12/29/2015 14:46   I have personally reviewed and evaluated these images and lab results as part of my medical decision-making.   MDM   Final diagnoses:  URI (upper respiratory infection)    25 year old male presents with sore throat and cough. Patient is afebrile. Vital signs stable. Posterior oropharynx without significant erythema, edema, or exudate. Lungs clear to auscultation bilaterally. Rapid strep ordered in triage. Will obtain CXR.  Rapid strep negative. CXR negative for acute infiltrate. Patient is non-toxic and well-appearing, feel he is stable for discharge at this time. Symptoms likely due to viral URI. Will treat with cough medicine and give ibuprofen for pain. Also advised patient to increase fluid intake and try warm honey, tea, and throat lozenges for additional symptom relief. Patient to follow-up with PCP. Return precautions discussed. Patient verbalizes his understanding and is in agreement with plan.  BP 124/84 mmHg  Pulse 75  Temp(Src) 98.1 F (36.7 C) (Oral)  Resp 18  SpO2 100%   I personally performed the services described in this documentation, which was scribed in my presence. The recorded information has been reviewed and is accurate.  Mady Gemmalizabeth C Westfall,  PA-C 12/29/15 1459  Courteney Randall AnLyn Mackuen, MD 12/30/15 [phone]

## 2016-01-01 LAB — CULTURE, GROUP A STREP (THRC)

## 2016-07-05 ENCOUNTER — Emergency Department (HOSPITAL_COMMUNITY): Payer: Self-pay

## 2016-07-05 ENCOUNTER — Encounter (HOSPITAL_COMMUNITY): Payer: Self-pay | Admitting: Nurse Practitioner

## 2016-07-05 ENCOUNTER — Emergency Department (HOSPITAL_COMMUNITY)
Admission: EM | Admit: 2016-07-05 | Discharge: 2016-07-05 | Disposition: A | Discharge status: Home / Self Care | Payer: Self-pay | Attending: Emergency Medicine | Admitting: Emergency Medicine

## 2016-07-05 DIAGNOSIS — F17210 Nicotine dependence, cigarettes, uncomplicated: Secondary | ICD-10-CM | POA: Insufficient documentation

## 2016-07-05 DIAGNOSIS — Z79899 Other long term (current) drug therapy: Secondary | ICD-10-CM | POA: Insufficient documentation

## 2016-07-05 DIAGNOSIS — M79672 Pain in left foot: Secondary | ICD-10-CM | POA: Insufficient documentation

## 2016-07-05 NOTE — ED Triage Notes (Signed)
Pt c/o left foot pain. Onset earlier today as he was playing basketball.

## 2016-07-05 NOTE — ED Provider Notes (Signed)
[facility]-EMERGENCY DEPT Provider Note   CSN: [id number] Arrival date & time: 07/05/16  1944  By signing my name below, I, Mitchell Fuller, attest that this documentation has been prepared under the direction and in the presence of Newell RubbermaidJeffrey Therin Vetsch, PA-C. Electronically Signed: Phillis HaggisGabriella Fuller, ED Scribe. 07/05/16. 8:42 PM.  History   Chief Complaint Chief Complaint  Patient presents with  . Foot Pain    Left    The history is provided by the patient. No language interpreter was used.   HPI Comments: Mitchell Fuller is a 25 y.o. male who presents to the Emergency Department complaining of lateral left foot pain onset earlier today. Pt was playing basketball prior to the pain starting. He reports worsening pain with palpation and ambulation. He has not taken anything for his pain. He denies other injury, numbness, or weakness.  Past Medical History:  Diagnosis Date  . Depression     Patient Active Problem List   Diagnosis Date Noted  . Altered mental status 06/01/2014    Past Surgical History:  Procedure Laterality Date  . NO PAST SURGERIES      Home Medications    Prior to Admission medications   Medication Sig Start Date End Date Taking? Authorizing Provider  benzonatate (TESSALON) 100 MG capsule Take 1 capsule (100 mg total) by mouth every 8 (eight) hours. 12/29/15   Mady GemmaElizabeth C Westfall, PA-C  ibuprofen (ADVIL,MOTRIN) 600 MG tablet Take 1 tablet (600 mg total) by mouth every 6 (six) hours as needed. 12/29/15   Mady GemmaElizabeth C Westfall, PA-C    Family History No family history on file.  Social History Social History  Substance Use Topics  . Smoking status: Current Some Day Smoker    Packs/day: 0.25    Types: Cigarettes  . Smokeless tobacco: Never Used  . Alcohol use Yes     Comment: social     Allergies   Review of patient's allergies indicates no known allergies.   Review of Systems Review of Systems  All other systems reviewed and are negative.  Physical  Exam Updated Vital Signs BP 121/81 (BP Location: Right Arm)   Pulse 101   Temp 98.2 F (36.8 C) (Oral)   Resp 14   SpO2 99%   Physical Exam  Constitutional: He is oriented to person, place, and time. He appears well-developed and well-nourished.  HENT:  Head: Normocephalic and atraumatic.  Eyes: EOM are normal.  Neck: Normal range of motion. Neck supple.  Cardiovascular: Normal rate.   Pulmonary/Chest: Effort normal.  Musculoskeletal: Normal range of motion.       Left foot: There is tenderness. There is no deformity.  TTP of the styloid process of the left fifth metatarsal; no obvious signs of trauma or deformity; no other TTP of the foot or ankle  Neurological: He is alert and oriented to person, place, and time.  Skin: Skin is warm and dry.  Psychiatric: He has a normal mood and affect. His behavior is normal.  Nursing note and vitals reviewed.    ED Treatments / Results  DIAGNOSTIC STUDIES: Oxygen Saturation is 99% on RA, normal by my interpretation.    COORDINATION OF CARE: 8:43 PM-Discussed treatment plan which includes x-ray with pt at bedside and pt agreed to plan.    Labs (all labs ordered are listed, but only abnormal results are displayed) Labs Reviewed - No data to display  EKG  EKG Interpretation None       Radiology Dg Foot Complete Left  Result  Date: 07/05/2016 CLINICAL DATA:  Left foot injury playing basketball today with pain laterally. EXAM: LEFT FOOT - COMPLETE 3+ VIEW COMPARISON:  None. FINDINGS: Exam demonstrates a subtle chip fracture along the medial base of the first proximal phalanx. Remainder of the exam is within normal. IMPRESSION: Small chip fracture along the medial base of the first proximal phalanx. Electronically Signed   By: Elberta Fortis M.D.   On: 07/05/2016 21:14    Procedures Procedures (including critical care time)  Medications Ordered in ED Medications - No data to display   Initial Impression / Assessment and Plan /  ED Course  I have reviewed the triage vital signs and the nursing notes.  Pertinent labs & imaging results that were available during my care of the patient were reviewed by me and considered in my medical decision making (see chart for details).  Clinical Course   Final Clinical Impressions(s) / ED Diagnoses   Final diagnoses:  Foot pain, left   Labs:  Imaging:  Consults:  Therapeutics:  Discharge Meds:   Assessment/Plan:  Patient X-Ray negative for obvious fracture or dislocation. Pain managed in ED. Pt advised to follow up with orthopedics if symptoms persist. Patient given brace while in ED, conservative therapy recommended and discussed. Patient will be dc home & is agreeable with above plan.  I personally performed the services described in this documentation, which was scribed in my presence. The recorded information has been reviewed and is accurate.   New Prescriptions New Prescriptions   No medications on file     Eyvonne Mechanic, PA-C 07/05/16 2132    Rolland Porter, MD 07/17/16 [phone]

## 2016-07-05 NOTE — Discharge Instructions (Signed)
Please read attached information. If you experience any new or worsening signs or symptoms please return to the emergency room for evaluation. Please follow-up with your primary care provider or specialist as discussed.  °

## 2017-03-27 ENCOUNTER — Encounter (HOSPITAL_COMMUNITY): Payer: Self-pay | Admitting: Nurse Practitioner

## 2017-03-27 DIAGNOSIS — Y999 Unspecified external cause status: Secondary | ICD-10-CM | POA: Insufficient documentation

## 2017-03-27 DIAGNOSIS — R042 Hemoptysis: Secondary | ICD-10-CM | POA: Insufficient documentation

## 2017-03-27 DIAGNOSIS — Y939 Activity, unspecified: Secondary | ICD-10-CM | POA: Diagnosis not present

## 2017-03-27 DIAGNOSIS — F17210 Nicotine dependence, cigarettes, uncomplicated: Secondary | ICD-10-CM | POA: Diagnosis not present

## 2017-03-27 DIAGNOSIS — S0181XA Laceration without foreign body of other part of head, initial encounter: Secondary | ICD-10-CM | POA: Diagnosis present

## 2017-03-27 DIAGNOSIS — Y929 Unspecified place or not applicable: Secondary | ICD-10-CM | POA: Diagnosis not present

## 2017-03-27 DIAGNOSIS — S0240DA Maxillary fracture, left side, initial encounter for closed fracture: Secondary | ICD-10-CM | POA: Diagnosis not present
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
## 2017-03-28 ENCOUNTER — Emergency Department (HOSPITAL_COMMUNITY)
Admission: EM | Admit: 2017-03-28 | Discharge: 2017-03-28 | Disposition: A | Discharge status: Home / Self Care | Payer: BLUE CROSS/BLUE SHIELD | Attending: Emergency Medicine | Admitting: Emergency Medicine

## 2017-03-28 ENCOUNTER — Emergency Department (HOSPITAL_COMMUNITY): Payer: BLUE CROSS/BLUE SHIELD

## 2017-03-28 DIAGNOSIS — S0240DA Maxillary fracture, left side, initial encounter for closed fracture: Secondary | ICD-10-CM

## 2017-03-28 HISTORY — DX: Bipolar disorder, unspecified: F31.9

## 2017-03-28 MED ORDER — TRAMADOL HCL 50 MG PO TABS: 50.0000 mg | ORAL_TABLET | Freq: Four times a day (QID) | ORAL | 0 refills | Status: AC | PRN

## 2017-03-28 MED ORDER — AMOXICILLIN-POT CLAVULANATE 875-125 MG PO TABS: 1.0000 | ORAL_TABLET | Freq: Two times a day (BID) | ORAL | 0 refills | Status: AC

## 2017-03-28 MED ORDER — IBUPROFEN 800 MG PO TABS: 800.0000 mg | ORAL_TABLET | Freq: Three times a day (TID) | ORAL | 0 refills | Status: AC | PRN

## 2017-03-28 NOTE — Discharge Instructions (Signed)
Follow-up with the, Dr. provided.  Return here as needed.  Use ice over the area to help minimize swelling.
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
## 2018-08-03 IMAGING — CR DG CHEST 2V
2 series · 2 of 2 positions shown · non-contrast
Comparison: 12/29/2015

CLINICAL DATA: Hemoptysis and mucus x1 week.

EXAM:
CHEST  2 VIEW

[w chest pa]
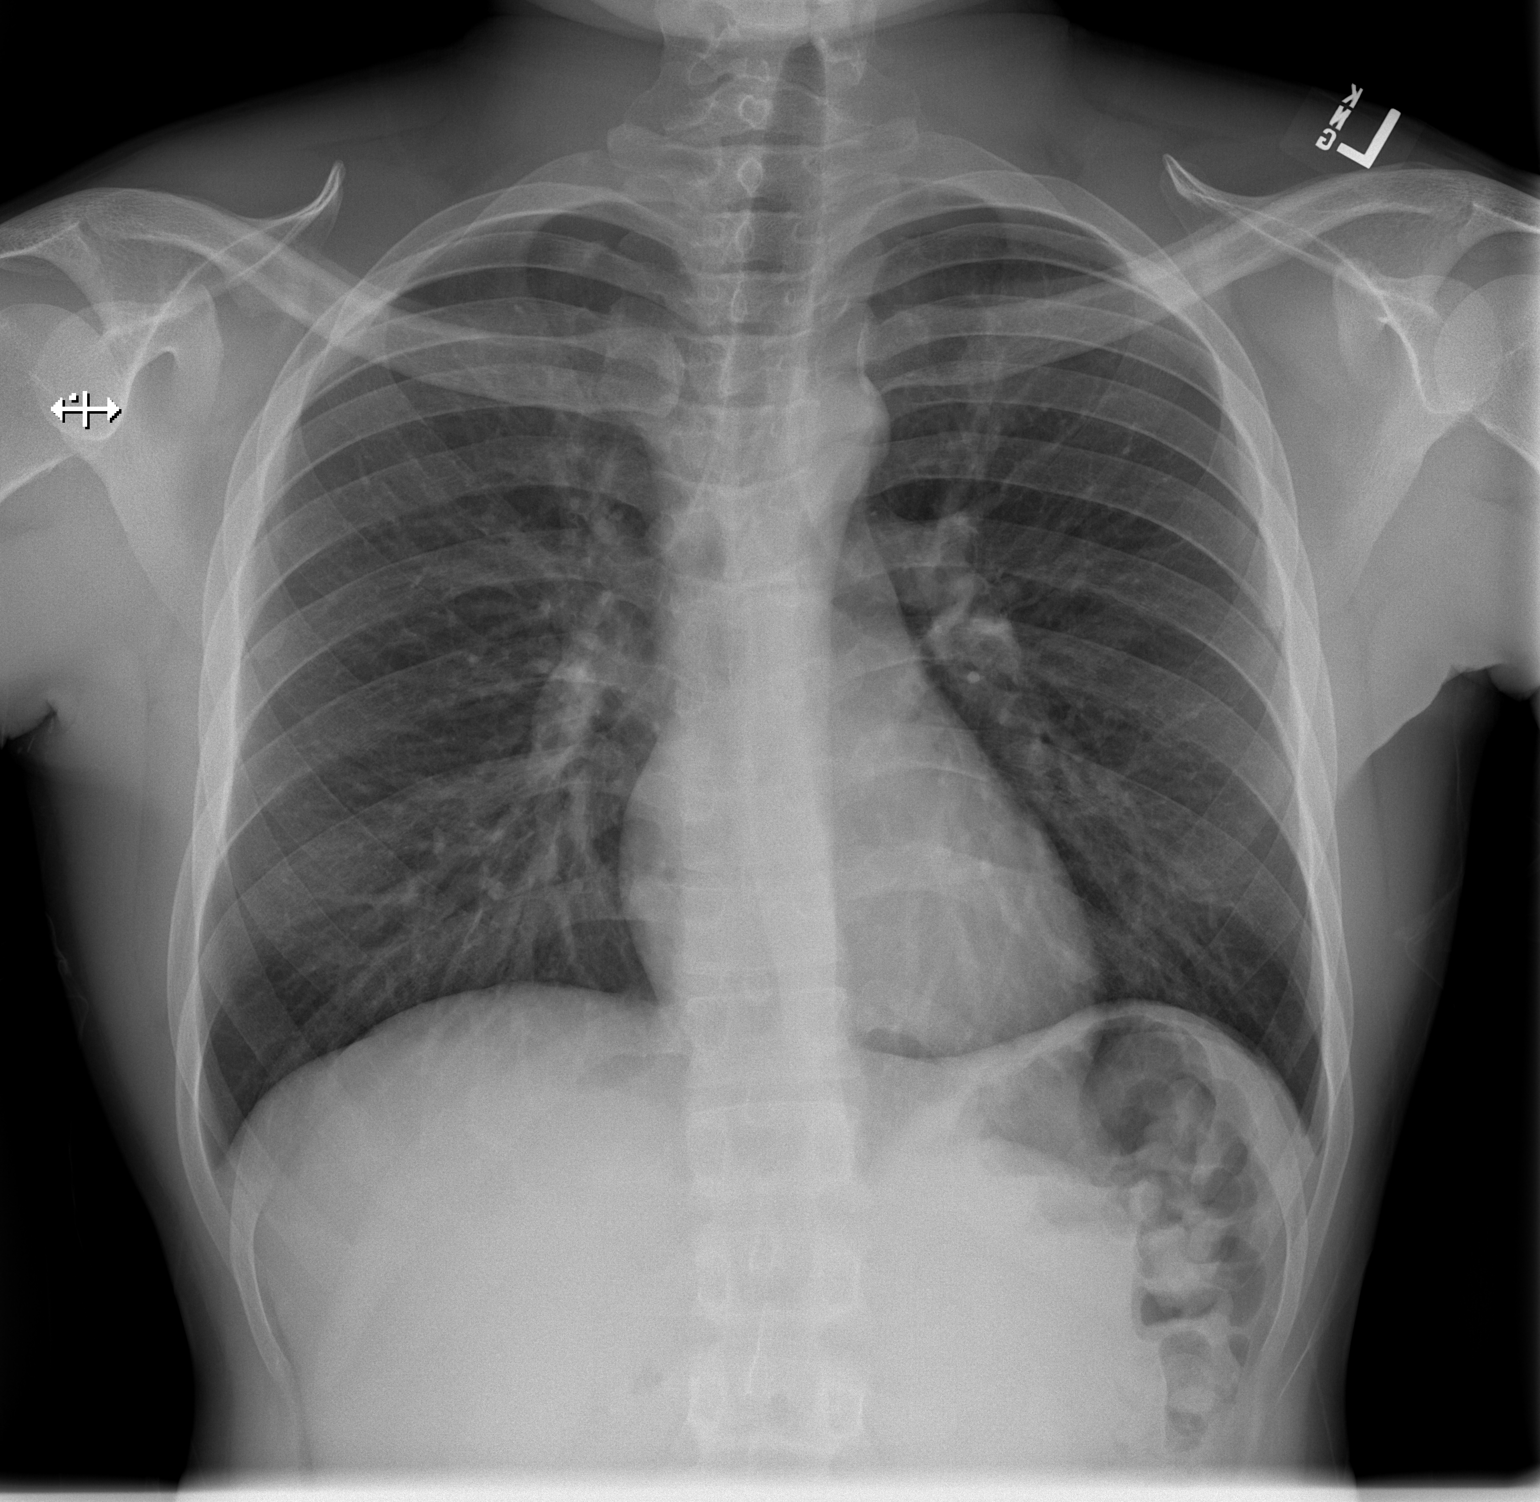

[w chest lat]
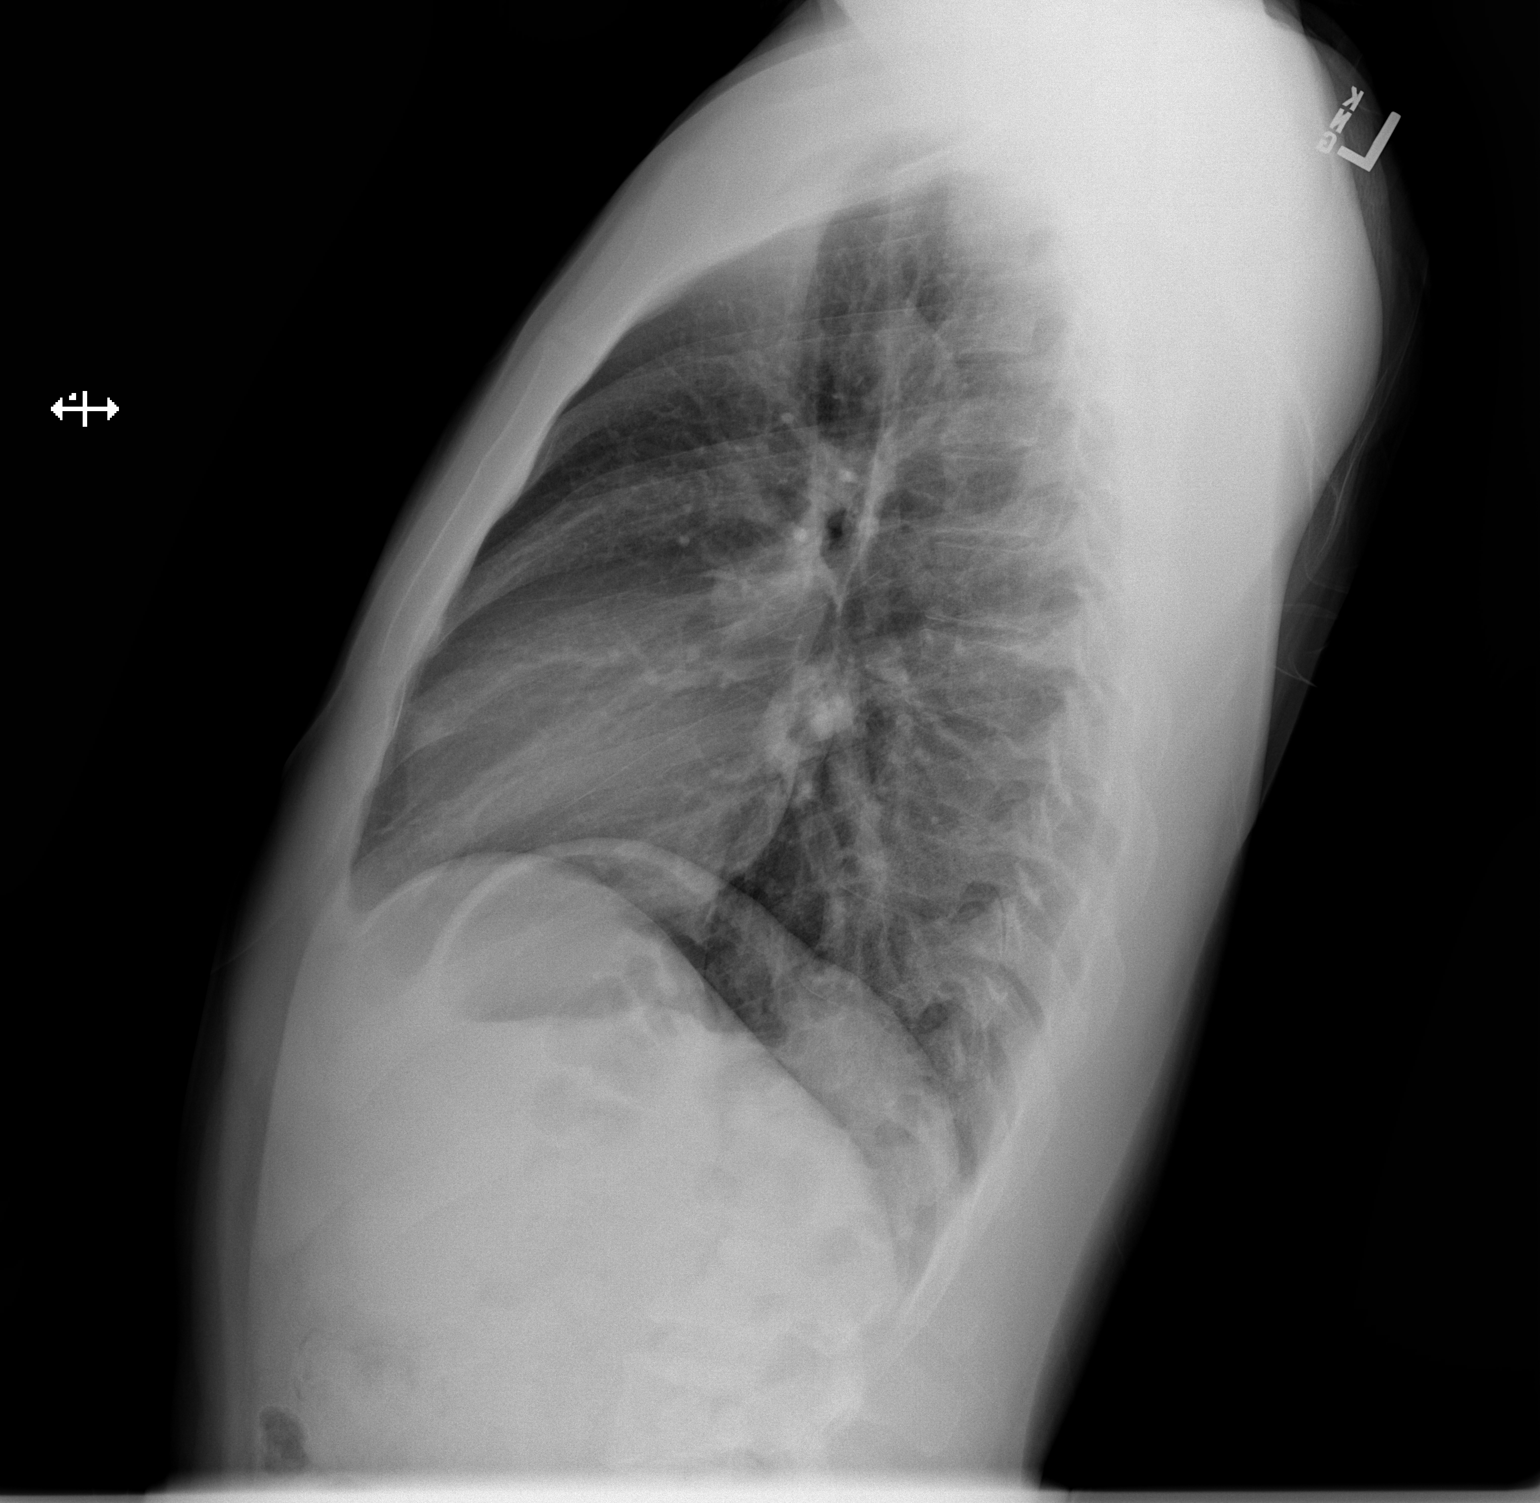

[2 of 2 positions shown; findings below may reference images not displayed]

FINDINGS: The heart size and mediastinal contours are within normal limits.
Both lungs are clear. The visualized skeletal structures are
unremarkable.
IMPRESSION: No active cardiopulmonary disease.
